# Patient Record
Sex: Female | Born: 1975 | Hispanic: No | Marital: Married | Smoking: Never smoker
Health system: Southern US, Community
[De-identification: ages and names within clinical notes are randomized; demographics above are authoritative.]

---

## 2017-01-25 ENCOUNTER — Encounter (HOSPITAL_COMMUNITY): Payer: Self-pay | Admitting: Emergency Medicine

## 2017-01-25 ENCOUNTER — Ambulatory Visit (HOSPITAL_COMMUNITY)
Admission: EM | Admit: 2017-01-25 | Discharge: 2017-01-25 | Disposition: A | Discharge status: Home / Self Care | Payer: Medicaid Other | Attending: Family Medicine | Admitting: Family Medicine

## 2017-01-25 ENCOUNTER — Other Ambulatory Visit: Payer: Self-pay

## 2017-01-25 DIAGNOSIS — M5441 Lumbago with sciatica, right side: Secondary | ICD-10-CM | POA: Diagnosis not present

## 2017-01-25 DIAGNOSIS — M5442 Lumbago with sciatica, left side: Secondary | ICD-10-CM | POA: Diagnosis not present

## 2017-01-25 MED ORDER — NAPROXEN 375 MG PO TABS: 375.0000 mg | ORAL_TABLET | Freq: Two times a day (BID) | ORAL | 0 refills | Status: AC

## 2017-01-25 NOTE — ED Provider Notes (Signed)
[facility]    CSN: [id number] Arrival date & time: 01/25/17  1437     History   Chief Complaint Chief Complaint  Patient presents with  . Rash  . Back Pain    HPI Kimberly Newton is a 41 y.o. female.   Kimberly Newton presents with family member with complaints of back pain as well as scarring to her face. She recently moved here from [place]. Family member requests to provide interpretation. 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 Pain is worse with movements, it radiates down her thighs. Rates pain 5/10 while sitting. Denies numbness, tingling or leg weakness. Has not taken any medications for her symptoms. Denies and previous back injury or back surgery.   Also complaint of "rash" to her cheeks which has been present for years. Has not tried any treatments for this. Is not worse today. Is not painful or itchy. Without drainage. Has not tried any treatments for this. Denies any previous known medical history. Does not take any medications daily.    ROS per HPI.       History reviewed. No pertinent past medical history.  There are no active problems to display for this patient.   History reviewed. No pertinent surgical history.  OB History    No data available       Home Medications    Prior to Admission medications   Medication Sig Start Date End Date Taking? Authorizing Provider  naproxen (NAPROSYN) 375 MG tablet Take 1 tablet (375 mg total) by mouth 2 (two) times daily with a meal. 01/25/17   Georgetta HaberBurky, Brandt Chaney B, NP    Family History No family history on file.  Social History Social History   Tobacco Use  . Smoking status: Never Smoker  Substance Use Topics  . Alcohol use: No    Frequency: Never  . Drug use: Not on file     Allergies   Patient has no known allergies.   Review of Systems Review of Systems   Physical Exam Triage Vital Signs ED Triage Vitals  Enc  Vitals Group     BP 01/25/17 1517 130/79     Pulse Rate 01/25/17 1517 81     Resp 01/25/17 1517 16     Temp 01/25/17 1517 98.3 F (36.8 C)     Temp src --      SpO2 01/25/17 1517 100 %     Weight --      Height --      Head Circumference --      Peak Flow --      Pain Score 01/25/17 1519 6     Pain Loc --      Pain Edu? --      Excl. in GC? --    No data found.  Updated Vital Signs BP 130/79   Pulse 81   Temp 98.3 F (36.8 C)   Resp 16   LMP 01/25/2017   SpO2 100%   Visual Acuity Right Eye Distance:   Left Eye Distance:   Bilateral Distance:    Right Eye Near:   Left Eye Near:    Bilateral Near:     Physical Exam  Constitutional: She is oriented to person, place, and time. She appears well-developed and well-nourished. No distress.  HENT:  Head:    Mild flushing noted to bilateral cheeks; dark scarring noted; without lesions, vesicles or  bullous structures  Cardiovascular: Normal rate, regular rhythm and normal heart sounds.  Pulmonary/Chest: Effort normal and breath sounds normal.  Musculoskeletal:       Lumbar back: She exhibits tenderness and pain. She exhibits normal range of motion, no bony tenderness, no swelling, no laceration, no spasm and normal pulse.  Bilateral low back musculature with reproducible tenderness on palpation; strength 5/5 bilaterally; sensation intact. Increased pain to left low back with left leg straight raise; without pain with heel or toe ambulation  Neurological: She is alert and oriented to person, place, and time.  Skin: Skin is warm and dry.  Vitals reviewed.    UC Treatments / Results  Labs (all labs ordered are listed, but only abnormal results are displayed) Labs Reviewed - No data to display  EKG  EKG Interpretation None       Radiology No results found.  Procedures Procedures (including critical care time)  Medications Ordered in UC Medications - No data to display   Initial Impression / Assessment and  Plan / UC Course  I have reviewed the triage vital signs and the nursing notes.  Pertinent labs & imaging results that were available during my care of the patient were reviewed by me and considered in my medical decision making (see chart for details).     Naproxen for low back pain, take with food. Discussed that facial rash appears to be acne scarring. Flushing noted, this may need further evaluation, ie lupus, per PCP. Recommended acne treatment with facial wash to prevent further acne. Recommended to establish with a primary care provider. If symptoms worsen or do not improve in the next 2-4 weeks to return to be seen or to follow up with PCP. Patient verbalized understanding and agreeable to plan. Ambulatory out of clinic without difficulty.     Final Clinical Impressions(s) / UC Diagnoses   Final diagnoses:  Acute bilateral low back pain with bilateral sciatica    ED Discharge Orders        Ordered    naproxen (NAPROSYN) 375 MG tablet  2 times daily with meals     01/25/17 1555       Controlled Substance Prescriptions  Controlled Substance Registry consulted? Not Applicable   Georgetta HaberBurky, Elisavet Buehrer B, NP 01/25/17 1610

## 2017-01-25 NOTE — Discharge Instructions (Signed)
May try face wash with salicylic acid which may benefit facial acne. May take prescribed Naproxen twice a day as needed for back pain.  Please establish with a primary care clinic to continue to monitor your symptoms.

## 2017-01-25 NOTE — ED Triage Notes (Signed)
Pt c/o back pain that travels down bilateral legs for 9 months. Also has a rash on her face for "years". Pt in NAD. States it hurts worse bending over.

## 2017-02-15 ENCOUNTER — Other Ambulatory Visit: Payer: Self-pay

## 2017-02-15 ENCOUNTER — Encounter (HOSPITAL_COMMUNITY): Payer: Self-pay | Admitting: *Deleted

## 2017-02-15 ENCOUNTER — Emergency Department (HOSPITAL_COMMUNITY): Payer: Medicaid Other

## 2017-02-15 ENCOUNTER — Emergency Department (HOSPITAL_COMMUNITY)
Admission: EM | Admit: 2017-02-15 | Discharge: 2017-02-15 | Discharge status: Against Medical Advice | Payer: Medicaid Other | Attending: Emergency Medicine | Admitting: Emergency Medicine

## 2017-02-15 DIAGNOSIS — R079 Chest pain, unspecified: Secondary | ICD-10-CM | POA: Diagnosis present

## 2017-02-15 DIAGNOSIS — Z79899 Other long term (current) drug therapy: Secondary | ICD-10-CM | POA: Diagnosis not present

## 2017-02-15 LAB — I-STAT TROPONIN, ED: Troponin i, poc: 0 ng/mL (ref 0.00–0.08)

## 2017-02-15 LAB — CBC
HCT: 39.9 % (ref 36.0–46.0)
HEMOGLOBIN: 12.7 g/dL (ref 12.0–15.0)
MCH: 25.2 pg — AB (ref 26.0–34.0)
MCHC: 31.8 g/dL (ref 30.0–36.0)
MCV: 79.2 fL (ref 78.0–100.0)
Platelets: 254 10*3/uL (ref 150–400)
RBC: 5.04 MIL/uL (ref 3.87–5.11)
RDW: 15.9 % — ABNORMAL HIGH (ref 11.5–15.5)
WBC: 5.3 10*3/uL (ref 4.0–10.5)

## 2017-02-15 LAB — I-STAT BETA HCG BLOOD, ED (MC, WL, AP ONLY): I-stat hCG, quantitative: <5 m[IU]/mL (ref <5)

## 2017-02-15 LAB — BASIC METABOLIC PANEL
ANION GAP: 7 (ref 5–15)
BUN: 7 mg/dL (ref 6–20)
CALCIUM: 8.8 mg/dL — AB (ref 8.9–10.3)
CHLORIDE: 104 mmol/L (ref 101–111)
CO2: 23 mmol/L (ref 22–32)
CREATININE: 0.57 mg/dL (ref 0.44–1.00)
GFR calc non Af Amer: >60 mL/min (ref >60)
GLUCOSE: 89 mg/dL (ref 65–99)
Potassium: 4 mmol/L (ref 3.5–5.1)
Sodium: 134 mmol/L — ABNORMAL LOW (ref 135–145)

## 2017-02-15 MED ORDER — KETOROLAC TROMETHAMINE 60 MG/2ML IM SOLN
60.0000 mg | Freq: Once | INTRAMUSCULAR | Status: AC
  Administered 2017-02-15: 60 mg via INTRAMUSCULAR
  Filled 2017-02-15: qty 2

## 2017-02-15 NOTE — ED Provider Notes (Signed)
[facility]   CSN: [id number] Arrival date & time: 02/15/17  1614     History   Chief Complaint Chief Complaint  Patient presents with  . Chest Pain    HPI Kimberly Newton is a 41 y.o. female.   Chest Pain   This is a new problem. The current episode started 6 to 12 hours ago. The problem occurs constantly. The problem has not changed since onset.The pain is present in the substernal region and lateral region. The pain is moderate. The quality of the pain is described as sharp. The pain does not radiate. Duration of episode(s) is 8 hours. The symptoms are aggravated by certain positions. Pertinent negatives include no back pain, no cough, no dizziness and no exertional chest pressure. She has tried nothing for the symptoms. The treatment provided no relief. There are no known risk factors.   Of note, patient has pain from right lower back going down the back of her right leg however this is chronic has been there for years.  States this is been worked up before and has been seen in the past for it.  Is not a new problem.  History reviewed. No pertinent past medical history.  There are no active problems to display for this patient.   History reviewed. No pertinent surgical history.  OB History    No data available       Home Medications    Prior to Admission medications   Medication Sig Start Date End Date Taking? Authorizing Provider  naproxen (NAPROSYN) 375 MG tablet Take 1 tablet (375 mg total) by mouth 2 (two) times daily with a meal. 01/25/17   Georgetta HaberBurky, Natalie B, NP    Family History History reviewed. No pertinent family history.  Social History Social History   Tobacco Use  . Smoking status: Never Smoker  Substance Use Topics  . Alcohol use: No    Frequency: Never  . Drug use: Not on file     Allergies   Patient has no known allergies.   Review of Systems Review of Systems  Respiratory: Negative  for cough.   Cardiovascular: Positive for chest pain.  Musculoskeletal: Negative for back pain.  Neurological: Negative for dizziness.  All other systems reviewed and are negative.    Physical Exam Updated Vital Signs BP 129/77   Pulse 62   Temp 98.4 F (36.9 C) (Oral)   Resp 16   Wt 75 kg (165 lb 5.5 oz)   LMP 01/25/2017   SpO2 100%   Physical Exam  Constitutional: She is oriented to person, place, and time. She appears well-developed and well-nourished.  HENT:  Head: Normocephalic and atraumatic.  Eyes: Conjunctivae and EOM are normal.  Neck: Normal range of motion.  Cardiovascular: Normal rate, regular rhythm and normal pulses.  Pulmonary/Chest: No stridor. No respiratory distress. She has no wheezes.  Abdominal: Soft. She exhibits no distension.  Musculoskeletal:       Left lower leg: She exhibits tenderness (right costochondral junction).  Neurological: She is alert and oriented to person, place, and time.  Skin: Skin is warm and dry. No rash noted. No erythema.  Nursing note and vitals reviewed.    ED Treatments / Results  Labs (all labs ordered are listed, but only abnormal results are displayed) Labs Reviewed  BASIC METABOLIC PANEL - Abnormal; Notable for the following components:      Result Value   Sodium 134 (*)    Calcium 8.8 (*)  All other components within normal limits  CBC - Abnormal; Notable for the following components:   MCH 25.2 (*)    RDW 15.9 (*)    All other components within normal limits  I-STAT TROPONIN, ED  I-STAT BETA HCG BLOOD, ED ([facility], [facility], [facility] ONLY)    EKG  EKG Interpretation  Date/Time:  Sunday February 15 2017 16:25:50 EST Ventricular Rate:  58 PR Interval:  152 QRS Duration: 80 QT Interval:  432 QTC Calculation: 424 R Axis:   20 Text Interpretation:  Sinus bradycardia Otherwise normal ECG Confirmed by Margarita Grizzleay, Danielle [phone]([id number]) on 02/15/2017 4:32:40 PM       Radiology Dg Chest 2 View  Result Date: 02/15/2017 CLINICAL  DATA:  Right-sided chest pain. EXAM: CHEST  2 VIEW COMPARISON:  None. FINDINGS: Mild cardiomegaly. The hila and mediastinum are unremarkable. No pneumothorax. No nodules or masses. No focal infiltrates. IMPRESSION: Mild cardiomegaly.  No acute abnormalities. Electronically Signed   By: Gerome Samavid  Williams III M.D   On: 02/15/2017 17:03    Procedures Procedures (including critical care time)  Medications Ordered in ED Medications  ketorolac (TORADOL) injection 60 mg (60 mg Intramuscular Given 02/15/17 1839)     Initial Impression / Assessment and Plan / ED Course  I have reviewed the triage vital signs and the nursing notes.  Pertinent labs & imaging results that were available during my care of the patient were reviewed by me and considered in my medical decision making (see chart for details).     Suspect MSK. Doubt ACS, but will check repeat troponin. toradol for symptoms. Leg pain is chronic, no swelling or calf ttp or other signs of DVT to worry about chest pain being related to PE. In fact, is PERC negative.   Patient eloped prior to repeat troponin/ecg.  Final Clinical Impressions(s) / ED Diagnoses   Final diagnoses:  Chest pain, unspecified type    ED Discharge Orders    None       Aryaan Persichetti, Barbara CowerJason, MD 02/15/17 1940

## 2017-02-15 NOTE — ED Triage Notes (Addendum)
Pt and family members report right side chest pain that started this am, has mild cough. Denies sob. Also has pain to entire right leg, denies injury.

## 2017-02-15 NOTE — ED Notes (Signed)
Pt seen by this nurse walking up hallway with husband and another family member.  Husband reports pt wants to leave and does not want blood re-drawn.  Dr. Clayborne DanaMesner made aware.

## 2018-02-19 IMAGING — DX DG CHEST 2V
2 series · 2 of 2 positions shown · non-contrast
Comparison: None.

CLINICAL DATA: Right-sided chest pain.

EXAM:
CHEST  2 VIEW

[chest pa]
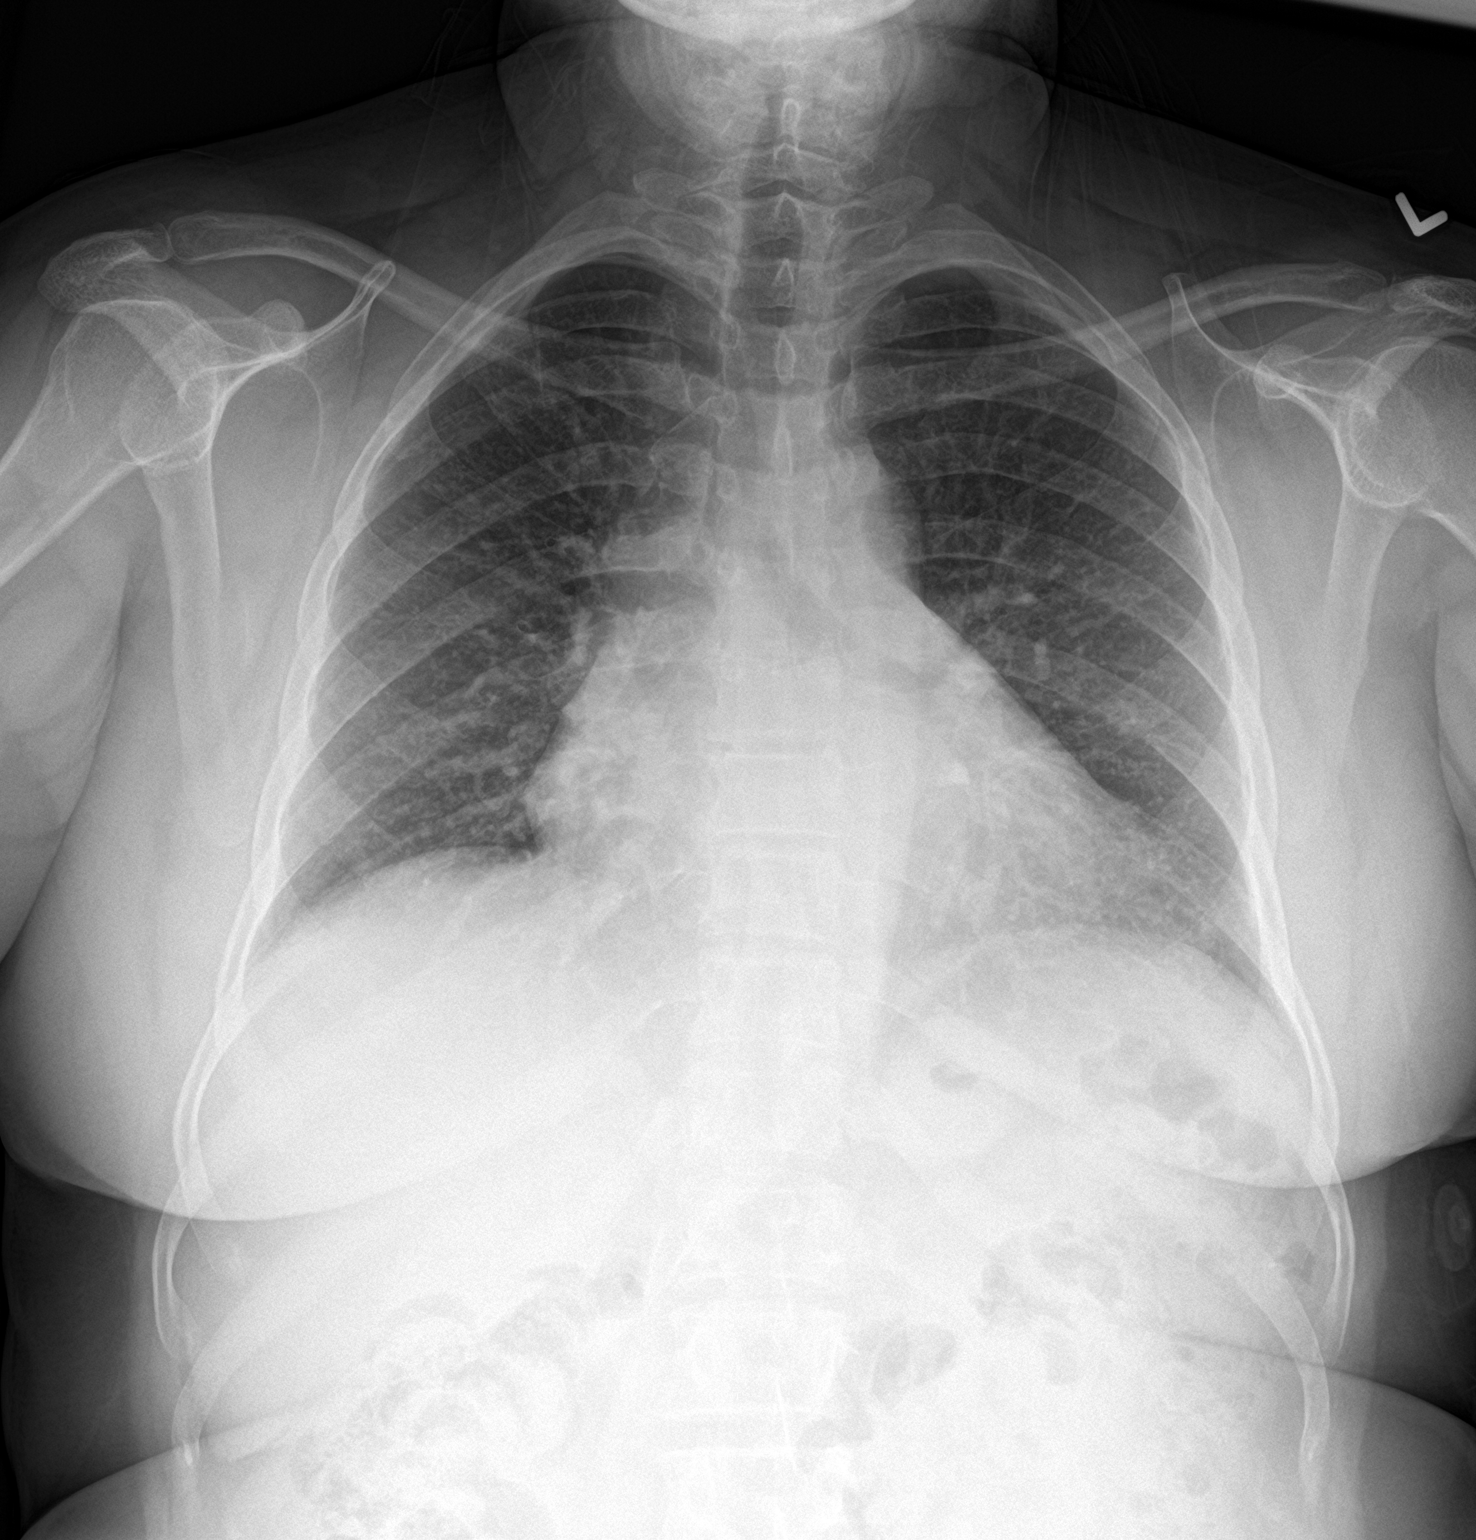

[chest lat]
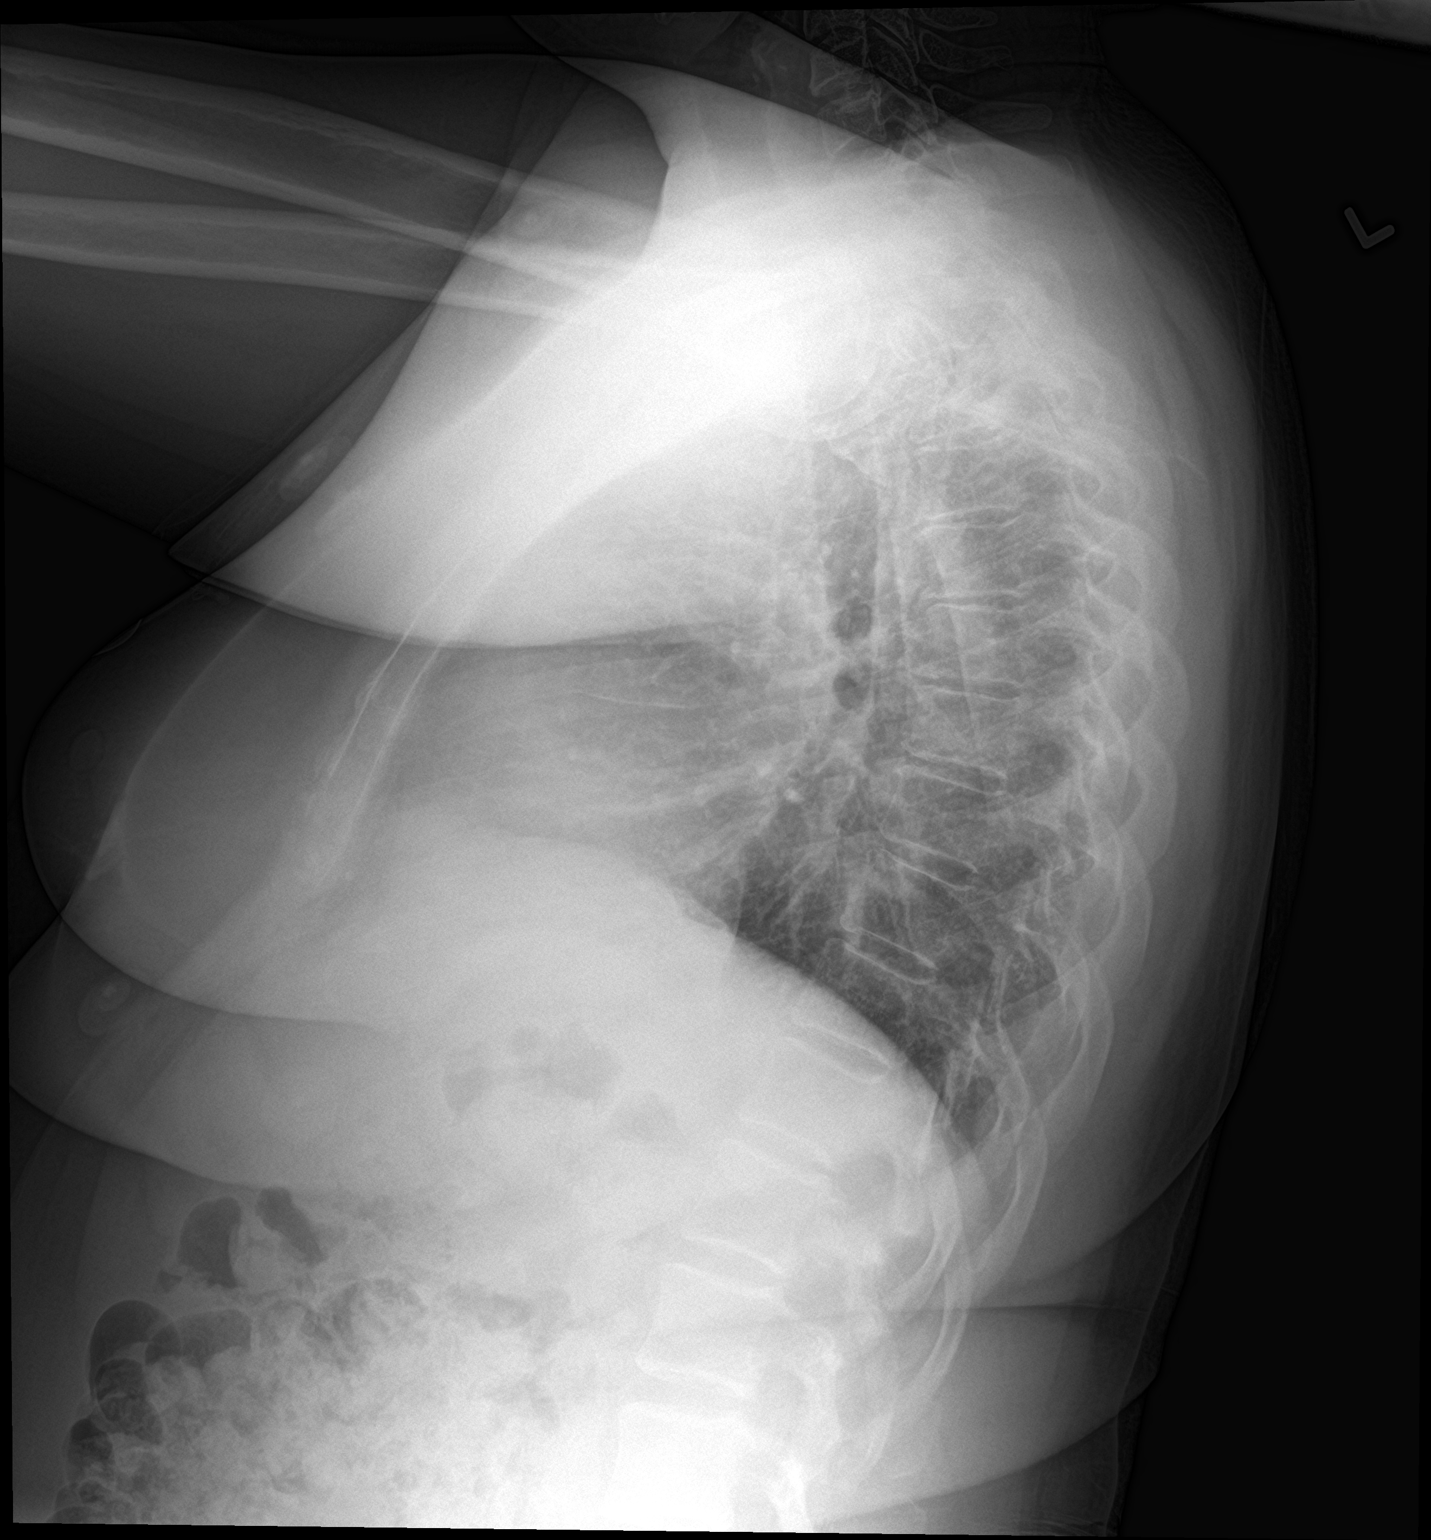

[2 of 2 positions shown; findings below may reference images not displayed]

FINDINGS: Mild cardiomegaly. The hila and mediastinum are unremarkable. No
pneumothorax. No nodules or masses. No focal infiltrates.
IMPRESSION: Mild cardiomegaly.  No acute abnormalities.
